# Patient Record
Sex: Male | Born: 1985 | Race: White | Hispanic: No | Marital: Single | Smoking: Current some day smoker
Health system: Southern US, Community
[De-identification: ages and names within clinical notes are randomized; demographics above are authoritative.]

## PROBLEM LIST (undated history)

## (undated) DIAGNOSIS — F419 Anxiety disorder, unspecified: Secondary | ICD-10-CM

## (undated) HISTORY — DX: Anxiety disorder, unspecified: F41.9

---

## 2013-10-14 LAB — COMPREHENSIVE METABOLIC PANEL
ALBUMIN: 4.6 g/dL (ref 3.4–5.0)
AST: 21 U/L (ref 15–37)
Alkaline Phosphatase: 62 U/L
Anion Gap: 3 — ABNORMAL LOW (ref 7–16)
BILIRUBIN TOTAL: 0.5 mg/dL (ref 0.2–1.0)
BUN: 11 mg/dL (ref 7–18)
Calcium, Total: 9.1 mg/dL (ref 8.5–10.1)
Chloride: 104 mmol/L (ref 98–107)
Co2: 29 mmol/L (ref 21–32)
Creatinine: 0.89 mg/dL (ref 0.60–1.30)
GLUCOSE: 88 mg/dL (ref 65–99)
OSMOLALITY: 271 (ref 275–301)
Potassium: 3.9 mmol/L (ref 3.5–5.1)
SGPT (ALT): 21 U/L (ref 12–78)
SODIUM: 136 mmol/L (ref 136–145)
Total Protein: 7.9 g/dL (ref 6.4–8.2)

## 2013-10-14 LAB — DRUG SCREEN, URINE
AMPHETAMINES, UR SCREEN: NEGATIVE (ref ?–1000)
BENZODIAZEPINE, UR SCRN: NEGATIVE (ref ?–200)
Barbiturates, Ur Screen: NEGATIVE (ref ?–200)
CANNABINOID 50 NG, UR ~~LOC~~: POSITIVE (ref ?–50)
COCAINE METABOLITE, UR ~~LOC~~: NEGATIVE (ref ?–300)
MDMA (Ecstasy)Ur Screen: NEGATIVE (ref ?–500)
Methadone, Ur Screen: NEGATIVE (ref ?–300)
OPIATE, UR SCREEN: NEGATIVE (ref ?–300)
Phencyclidine (PCP) Ur S: NEGATIVE (ref ?–25)
Tricyclic, Ur Screen: NEGATIVE (ref ?–1000)

## 2013-10-14 LAB — CBC
HCT: 51.9 % (ref 40.0–52.0)
HGB: 17.8 g/dL (ref 13.0–18.0)
MCH: 31.4 pg (ref 26.0–34.0)
MCHC: 34.2 g/dL (ref 32.0–36.0)
MCV: 92 fL (ref 80–100)
Platelet: 181 10*3/uL (ref 150–440)
RBC: 5.65 10*6/uL (ref 4.40–5.90)
RDW: 12.8 % (ref 11.5–14.5)
WBC: 8.7 10*3/uL (ref 3.8–10.6)

## 2013-10-14 LAB — ETHANOL
Ethanol %: <0.003 % (ref 0.000–0.080)
Ethanol: <3 mg/dL

## 2013-10-14 LAB — TSH: THYROID STIMULATING HORM: 1.02 u[IU]/mL

## 2013-10-14 LAB — SALICYLATE LEVEL

## 2013-10-14 LAB — ACETAMINOPHEN LEVEL

## 2013-10-15 ENCOUNTER — Inpatient Hospital Stay: Payer: Self-pay | Admitting: Psychiatry

## 2014-12-11 NOTE — H&P (Signed)
PATIENT NAME:  Andrew Stein, Andrew Stein MR#:  [id number] DATE OF BIRTH:  1986/08/03  DATE OF ADMISSION:  10/15/2013  REFERRING PHYSICIAN: Emergency room MD  ATTENDING PHYSICIAN: Kristine Linea, MD   IDENTIFYING DATA: Andrew Stein is a 29 year old male with no past psychiatric history.   CHIEF COMPLAINT: "I was really scared."   HISTORY OF PRESENT ILLNESS: Andrew Stein was brought to the hospital by his mother. She was fearful for his life. He has been recently depressed but also angry threatening to kill his wife and himself. 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 On Valentine's Day, his wife told him that she needs some time off and asked him to move out of the house they were sharing with her grandmother. He also lost his job in [employer] at that time. The patient's mother who lives in [place] drove to [place] to pick him up and all his belongings and brought him to [facility]. The patient already established contact with [facility] for treatment of depression and substance abuse. On the day of admission, however, he was quiet upset and after he made suicidal and homicidal threats his mother encouraged him to come to the hospital. There is a long history of substance abuse, but the patient reports that he has not been using any substances except for marijuana since July. In July he was arrested for possession of cocaine. He is court ordered to go to [facility] for substance abuse treatment. His legal history is rather convoluted, but apparently the patient was in court on Tuesday. He was hoping that his minor charge will be thrown out of court, but the judge decided to proceed. The patient is uncertain at this point what is going to happen. He has another court date on the 9th of March that he must attend. He denies symptoms of anxiety, denies psychotic symptoms, denies symptoms suggestive of bipolar mania. Recently he feels  depressed, on the edge, has poor sleep and decreased appetite. He can be tearful one minute and mad another. He feels that he has lost his temper many times since July. The legal charges are very stressful for him. The patient was in prison in 2003. Following release from prison, he finished 2 year associate degree and was hoping that is on a career path. His recent legal charges that he admits result from his stupidity will impede his progress.  PAST PSYCHIATRIC HISTORY: He has never been treated. No suicide attempts. He tried several benzodiazepines, Xanax and Klonopin. He does not like. His wife has been recently prescribed Valium and he thinks that it has been helpful. He has never been treated for substance use.   FAMILY PSYCHIATRIC HISTORY: Mother with bipolar.   PAST MEDICAL HISTORY: None.   ALLERGIES: No known drug allergies.   MEDICATIONS ON ADMISSION: None.   SOCIAL HISTORY: As above. He grew up in a complicated house. When his parents divorced he went to live with his grandparents. When grandparents divorced he went to live with his mother who was then remarried. He had behavioral problems at school, however, he graduated. He ended up in jail. He completed 2 year associate degree. He used to work as a Pensions consultant in [employer] but lost his job but believes that he could get it back. He had been married for 9 years and now his marriage is on the rocks. He still has legal charges pending. There is a family conflict. His  mother believes that he needs to stay in [place] away from the wife. The patient believes that he needs to go back to [place] where his home is, try to work things out with his wife, get his job and face his legal challenges.   REVIEW OF SYSTEMS: CONSTITUTIONAL: No fevers or chills. No weight changes.  EYES: No double or blurred vision.  ENT: No hearing loss.  RESPIRATORY: No shortness of breath or cough.  CARDIOVASCULAR: No chest pain or orthopnea.  GASTROINTESTINAL: No  abdominal pain, nausea, vomiting, or diarrhea.  GENITOURINARY: No incontinence or frequency.  ENDOCRINE: No heat or cold intolerance.  LYMPHATIC: No anemia or easy bruising.  INTEGUMENTARY: No acne or rash.  MUSCULOSKELETAL: No muscle or joint pain.  NEUROLOGIC: No tingling or weakness.  PSYCHIATRIC: See history of present illness for details.   PHYSICAL EXAMINATION: VITAL SIGNS: Blood pressure 112/76, pulse 80, respirations 20, temperature 98.1.  GENERAL: This is a well-developed male in no acute distress.  HEENT: The pupils are equal, round, and reactive to light. Sclerae anicteric.  NECK: Supple. No thyromegaly.  LUNGS: Clear to auscultation. No dullness to percussion.  HEART: Regular rhythm and rate. No murmurs, rubs, or gallops.  ABDOMEN: Soft, nontender, nondistended. Positive bowel sounds.  MUSCULOSKELETAL: Normal muscle strength in all extremities.  SKIN: No rashes or bruises.  LYMPHATIC: No cervical adenopathy.  NEUROLOGIC: Cranial nerves II through XII are intact.   LABORATORY DATA: Chemistries are within normal limits. Blood alcohol level is zero. LFTs within normal limits. TSH 1.02. Urine tox screen positive for cannabis. CBC within normal limits. Serum acetaminophen and salicylates are low.  MENTAL STATUS EXAMINATION ON ADMISSION: The patient is alert and oriented to person, place, time, and situation. He is pleasant, polite and cooperative, slightly tearful. He maintains some eye contact. His speech is of normal rhythm, rate, and volume. Mood is depressed with sad affect. Thought process is logical and goal oriented. Thought content: He denies suicidal or homicidal ideation at the moment, but prior to admission he was angry with his wife and threatened to kill her and himself. There are no delusions or paranoia. There are no auditory or visual hallucinations. His cognition is grossly intact. He registers three out of three and recalls three out of three objects after 5 minutes.  He can spell "world" forwards and backwards. He knows the current president. He is intelligent with good fund of knowledge. His insight and judgment are fair.   SUICIDE RISK ASSESSMENT ON ADMISSION: This is a patient with a history of substance abuse who became depressed, suicidal, and homicidal in the context of severe social stressors including marital conflict and separation. He is at increased risk of suicide.   DIAGNOSES: AXIS I:  1.  Major depressive episode. 2. Cannibis abuse.  AXIS II: Deferred.  AXIS III: Deferred.  AXIS IV: Mental illness, substance abuse, marital, family conflict, employment, legal  AXIS V: Global assessment of functioning 25.   PLAN: The patient was admitted to [facility] Medicine unit for safety, stabilization, and medication management. He was initially placed on suicide precautions and was closely monitored for any unsafe behaviors. He underwent full psychiatric and risk assessment. He received pharmacotherapy, individual and group psychotherapy, substance abuse counseling, and support from therapeutic milieu.  1.  Suicidal, homicidal ideation: The patient is able to contract for safety.  2.  Mood:  The patient has a history of mood swings, hyperactivity, impulsivity, and insomnia. His mother has bipolar. He would like  to try a medication to help him manage his symptoms better so that he can be a better husband and calmer on the job. He agreed to start Tegretol.  3.  Substance abuse:  The patient is already court ordered to go to [facility]. He also made connection with [facility]. He can go to [facility] in [place] or in [place] for both mental health and substance abuse problems.  4.  Disposition: He will be discharged to home. ____________________________ Ellin GoodieJolanta B. Jennet MaduroPucilowska, MD jbp:sb D: 10/16/2013 10:19:21 ET T: 10/16/2013 11:46:35 ET JOB#: [id number]  cc: Jolanta B. Jennet MaduroPucilowska, MD, <Dictator> Shari ProwsJOLANTA B PUCILOWSKA MD ELECTRONICALLY  SIGNED 10/31/2013 14:54

## 2020-04-25 ENCOUNTER — Other Ambulatory Visit: Payer: Self-pay

## 2020-04-25 ENCOUNTER — Emergency Department
Admission: EM | Admit: 2020-04-25 | Discharge: 2020-04-25 | Disposition: A | Discharge status: Home / Self Care | Payer: BC Managed Care – PPO | Attending: Emergency Medicine | Admitting: Emergency Medicine

## 2020-04-25 DIAGNOSIS — F17200 Nicotine dependence, unspecified, uncomplicated: Secondary | ICD-10-CM | POA: Insufficient documentation

## 2020-04-25 DIAGNOSIS — R0981 Nasal congestion: Secondary | ICD-10-CM | POA: Diagnosis present

## 2020-04-25 DIAGNOSIS — J069 Acute upper respiratory infection, unspecified: Secondary | ICD-10-CM | POA: Insufficient documentation

## 2020-04-25 MED ORDER — IBUPROFEN 400 MG PO TABS
400.0000 mg | ORAL_TABLET | Freq: Once | ORAL | Status: AC
  Administered 2020-04-25: 400 mg via ORAL
  Filled 2020-04-25: qty 1

## 2020-04-25 NOTE — ED Notes (Signed)
See triage note  Presents with fever and sinus pressure  States he sneezed and developed pain to frontal area   sxs' started yesterday

## 2020-04-25 NOTE — ED Notes (Signed)
Pt refused COVID swab. Provider aware.

## 2020-04-25 NOTE — Discharge Instructions (Addendum)
Follow-up with your primary care provider.  Tylenol ibuprofen as needed for fever, headache, body aches.  Begin using saline nasal spray and also you may take Mucinex to help with sinus congestion.  Sinus congestion and sinus infection are treated differently.  If you continue to have sinus congestion after 1 week your primary care provider can reevaluate if to see if antibiotics are needed at that time.

## 2020-04-25 NOTE — ED Provider Notes (Signed)
[facility] Emergency Department Provider Note   ____________________________________________   First MD Initiated Contact with Patient 04/25/20 1017     (approximate)  I have reviewed the triage vital signs and the nursing notes.   HISTORY  Chief Complaint URI   HPI Andrew Stein is a 34 y.o. male presents to the ED with complaint of sinus congestion, headache and fever that started suddenly yesterday.  He [place] that [place]  Is an smoker but infrequent.  He states that he he sneezed which increased his sinus congestion.  Patient has not had the Covid vaccine.  He rates pain as an 8 out of 10.      History reviewed. No pertinent past medical history.  There are no problems to display for this patient.   History reviewed. No pertinent surgical history.  Prior to Admission medications   Not on File    Allergies Patient has no known allergies.  No family history on file.  Social History Social History   Tobacco Use  . Smoking status: Current Some Day Smoker  . Smokeless tobacco: Never Used  Substance Use Topics  . Alcohol use: Yes  . Drug use: Not Currently    Review of Systems Constitutional: Subjective fever/chills Eyes: No visual changes. ENT: No sore throat.  Positive sinus congestion. Cardiovascular: Denies chest pain. Respiratory: Denies shortness of breath.  Negative for cough. Gastrointestinal: No abdominal pain.  No nausea, no vomiting.  No diarrhea.  No constipation. Musculoskeletal: Negative for joint aches. Skin: Negative for rash. Neurological: Positive for headaches, negative for focal weakness or numbness.  ____________________________________________   PHYSICAL EXAM:  VITAL SIGNS: ED Triage Vitals  Enc Vitals Group     BP 04/25/20 0858 126/85     Pulse Rate 04/25/20 0858 88     Resp 04/25/20 0858 16     Temp 04/25/20  0858 (!) 101.1 F (38.4 C)     Temp Source 04/25/20 0858 Oral     SpO2 04/25/20 0858 92 %     Weight 04/25/20 0855 170 lb (77.1 kg)     Height 04/25/20 0855 5\' 9"  (1.753 m)     Head Circumference --      Peak Flow --      Pain Score 04/25/20 0855 8     Pain Loc --      Pain Edu? --      Excl. in GC? --     Constitutional: Alert and oriented. Well appearing and in no acute distress. Eyes: Conjunctivae are normal. PERRL. EOMI. Head: Atraumatic. Nose: No congestion/rhinnorhea. Mouth/Throat: Mucous membranes are moist.  Oropharynx non-erythematous. Neck: No stridor.   Cardiovascular: Normal rate, regular rhythm. Grossly normal heart sounds.  Good peripheral circulation. Respiratory: Normal respiratory effort.  No retractions. Lungs CTAB. Gastrointestinal: Soft and nontender. No distention. No abdominal bruits. No CVA tenderness. Musculoskeletal: No lower extremity tenderness nor edema.  No joint effusions. Neurologic:  Normal speech and language. No gross focal neurologic deficits are appreciated. No gait instability. Skin:  Skin is warm, dry and intact. No rash noted. Psychiatric: Mood and affect are normal. Speech and behavior are normal.  ____________________________________________   LABS (all labs ordered are listed, but only abnormal results are displayed)  Labs Reviewed  SARS CORONAVIRUS 2 BY RT PCR (HOSPITAL ORDER, PERFORMED IN [facility])    PROCEDURES  Procedure(s) performed (including Critical Care):  Procedures  ____________________________________________   INITIAL IMPRESSION / ASSESSMENT AND PLAN / ED COURSE  As part of my medical decision making, I reviewed the following data within the electronic MEDICAL RECORD NUMBER Notes from prior ED visits and [place] Controlled Substance Database  Andrew Stein was evaluated in Emergency Department on 04/25/2020 for the symptoms described in the history of present illness. He was evaluated in the  context of the global COVID-19 pandemic, which necessitated consideration that the patient might be at risk for infection with the SARS-CoV-2 virus that causes COVID-19. Institutional protocols and algorithms that pertain to the evaluation of patients at risk for COVID-19 are in a state of rapid change based on information released by regulatory bodies including the CDC and federal and state organizations. These policies and algorithms were followed during the patient's care in the ED.  66-year-old male presents to the ED with complaint of sudden onset yesterday of sinus congestion, headache and fever.  Patient states that he was down [place] mowing and believes that he has a sinus infection instead.  A Covid test was ordered however patient refused to have a Covid test done.  Patient was told that patient's have similar symptoms to what he is describing at this time and are positive for Covid.  Patient still declined a Covid screening.  Patient was discharged with instructions to increase fluids.  Take Tylenol or ibuprofen as needed for fever and headache.  He is to follow-up with his PCP if any continued problems.  He was also told he could use saline nose spray to help with his nasal congestion.  URI versus Covid infection.  ____________________________________________   FINAL CLINICAL IMPRESSION(S) / ED DIAGNOSES  Final diagnoses:  Upper respiratory tract infection, unspecified type     ED Discharge Orders    None       Note:  This document was prepared using Dragon voice recognition software and may include unintentional dictation errors.    Tommi Rumps, PA-C 04/25/20 1149    Minna Antis, MD 04/25/20 1443

## 2020-04-25 NOTE — ED Triage Notes (Signed)
Pt c/o sinus congestion with HA and fever that started yesterday

## 2020-08-05 ENCOUNTER — Other Ambulatory Visit: Payer: Self-pay

## 2020-08-05 ENCOUNTER — Emergency Department
Admission: EM | Admit: 2020-08-05 | Discharge: 2020-08-05 | Disposition: A | Discharge status: Home / Self Care | Payer: BC Managed Care – PPO | Attending: Emergency Medicine | Admitting: Emergency Medicine

## 2020-08-05 DIAGNOSIS — Z566 Other physical and mental strain related to work: Secondary | ICD-10-CM | POA: Diagnosis not present

## 2020-08-05 DIAGNOSIS — F17200 Nicotine dependence, unspecified, uncomplicated: Secondary | ICD-10-CM | POA: Diagnosis not present

## 2020-08-05 DIAGNOSIS — R002 Palpitations: Secondary | ICD-10-CM | POA: Insufficient documentation

## 2020-08-05 DIAGNOSIS — F419 Anxiety disorder, unspecified: Secondary | ICD-10-CM | POA: Insufficient documentation

## 2020-08-05 LAB — CBC
HCT: 49.2 % (ref 39.0–52.0)
Hemoglobin: 17.4 g/dL — ABNORMAL HIGH (ref 13.0–17.0)
MCH: 32 pg (ref 26.0–34.0)
MCHC: 35.4 g/dL (ref 30.0–36.0)
MCV: 90.6 fL (ref 80.0–100.0)
Platelets: 219 10*3/uL (ref 150–400)
RBC: 5.43 MIL/uL (ref 4.22–5.81)
RDW: 12.3 % (ref 11.5–15.5)
WBC: 6.6 10*3/uL (ref 4.0–10.5)
nRBC: 0 % (ref 0.0–0.2)

## 2020-08-05 LAB — TROPONIN I (HIGH SENSITIVITY): Troponin I (High Sensitivity): 3 ng/L (ref <18)

## 2020-08-05 LAB — BASIC METABOLIC PANEL
Anion gap: 9 (ref 5–15)
BUN: 10 mg/dL (ref 6–20)
CO2: 26 mmol/L (ref 22–32)
Calcium: 8.9 mg/dL (ref 8.9–10.3)
Chloride: 103 mmol/L (ref 98–111)
Creatinine, Ser: 0.89 mg/dL (ref 0.61–1.24)
GFR, Estimated: >60 mL/min (ref >60)
Glucose, Bld: 173 mg/dL — ABNORMAL HIGH (ref 70–99)
Potassium: 3.8 mmol/L (ref 3.5–5.1)
Sodium: 138 mmol/L (ref 135–145)

## 2020-08-05 MED ORDER — LORAZEPAM 0.5 MG PO TABS: 0.5000 mg | ORAL_TABLET | Freq: Two times a day (BID) | ORAL | 0 refills | Status: AC | PRN

## 2020-08-05 NOTE — ED Provider Notes (Signed)
[facility] Emergency Department Provider Note   ____________________________________________    I have reviewed the triage vital signs and the nursing notes.   HISTORY  Chief Complaint Palpitations     HPI Andrew Stein is a 34 y.o. male who presents with complaints of anxiety leading to palpitations.  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  He reports "whenever I start thinking about things my heart starts racing ".  He is hoping to back down from this position to a calmer job in the future.  No chest pain currently although he has tightness when he has palpitations as well.  No fevers chills or cough.  Past Medical History:  Diagnosis Date  . Anxiety     There are no problems to display for this patient.   History reviewed. No pertinent surgical history.  Prior to Admission medications   Medication Sig Start Date End Date Taking? Authorizing Provider  LORazepam (ATIVAN) 0.5 MG tablet Take 1 tablet (0.5 mg total) by mouth 2 (two) times daily as needed for up to 7 days for anxiety. 08/05/20 08/12/20  Jene Every, MD     Allergies Patient has no known allergies.  History reviewed. No pertinent family history.  Social History Social History   Tobacco Use  . Smoking status: Current Some Day Smoker    Packs/day: 0.50  . Smokeless tobacco: Never Used  Substance Use Topics  . Alcohol use: Yes  . Drug use: Not Currently    Types: Marijuana    Review of Systems  Constitutional: No fever/chills Eyes: No visual changes.  ENT: No sore throat. Cardiovascular: As above Respiratory: As above Gastrointestinal: No abdominal pain.  No nausea, no vomiting.   Genitourinary: Negative for dysuria. Musculoskeletal: Negative for back pain. Skin: Negative for rash. Neurological: Negative for headaches or  weakness   ____________________________________________   PHYSICAL EXAM:  VITAL SIGNS: ED Triage Vitals  Enc Vitals Group     BP 08/05/20 1245 126/72     Pulse Rate 08/05/20 1245 84     Resp 08/05/20 1245 20     Temp 08/05/20 1245 99 F (37.2 C)     Temp Source 08/05/20 1245 Oral     SpO2 08/05/20 1245 97 %     Weight 08/05/20 1312 77.1 kg (170 lb)     Height 08/05/20 1312 1.753 m (5\' 9" )     Head Circumference --      Peak Flow --      Pain Score 08/05/20 1311 0     Pain Loc --      Pain Edu? --      Excl. in GC? --     Constitutional: Alert and oriented. No acute distress. Pleasant and interactive Eyes: Conjunctivae are normal.  H Nose: No congestion/rhinnorhea. Mouth/Throat: Mucous membranes are moist.   Neck:  Painless ROM Cardiovascular: Normal rate, regular rhythm. Grossly normal heart sounds.  Good peripheral circulation. Respiratory: Normal respiratory effort.  No retractions. Lungs CTAB. Gastrointestinal: Soft and nontender. No distention.  No CVA tenderness. Genitourinary: deferred Musculoskeletal: No lower extremity tenderness nor edema.  Warm and well perfused Neurologic:  Normal speech and language. No gross focal neurologic deficits are appreciated.  Skin:  Skin is warm, dry and intact. No rash noted. Psychiatric: Mood and affect are normal. Speech and behavior are normal.  ____________________________________________   LABS (all labs ordered are listed, but only  abnormal results are displayed)  Labs Reviewed  BASIC METABOLIC PANEL - Abnormal; Notable for the following components:      Result Value   Glucose, Bld 173 (*)    All other components within normal limits  CBC - Abnormal; Notable for the following components:   Hemoglobin 17.4 (*)    All other components within normal limits  TROPONIN I (HIGH SENSITIVITY)  TROPONIN I (HIGH SENSITIVITY)   ____________________________________________  EKG  ED ECG REPORT I, Jene Every, the  attending physician, personally viewed and interpreted this ECG.  Date: 08/05/2020  Rhythm: normal sinus rhythm QRS Axis: normal Intervals: Nonspecific changes ST/T Wave abnormalities: normal Narrative Interpretation: no evidence of acute ischemia  ____________________________________________  RADIOLOGY  None ____________________________________________   PROCEDURES  Procedure(s) performed: No  Procedures   Critical Care performed: No ____________________________________________   INITIAL IMPRESSION / ASSESSMENT AND PLAN / ED COURSE  Pertinent labs & imaging results that were available during my care of the patient were reviewed by me and considered in my medical decision making (see chart for details).  Patient presents with intermittent palpitations related to stress/anxiety.  Currently feeling well although he did have chest tightness this morning, now improved.  EKG with some nonspecific ST changes however high-sensitivity opponent is normal.  Lab work is otherwise unremarkable.  He reports the symptoms typically correspond with stressful events/anxiety.  We will give low-dose Ativan to be used as needed sparingly, recommend outpatient follow-up, exercise stress reduction techniques    ____________________________________________   FINAL CLINICAL IMPRESSION(S) / ED DIAGNOSES  Final diagnoses:  Palpitations  Stress at work        Note:  This document was prepared using Conservation officer, historic buildings and may include unintentional dictation errors.   Jene Every, MD 08/05/20 [phone]

## 2020-08-05 NOTE — ED Triage Notes (Signed)
Pt here with c/o palpitations and tachycardia, states some palpitations are worse than others. Pt reports high anxiety and stressers. Denies chest pain or shortness of breath.

## 2021-03-13 ENCOUNTER — Encounter: Payer: Self-pay | Admitting: Emergency Medicine

## 2021-03-13 ENCOUNTER — Emergency Department: Payer: BC Managed Care – PPO

## 2021-03-13 ENCOUNTER — Emergency Department
Admission: EM | Admit: 2021-03-13 | Discharge: 2021-03-13 | Disposition: A | Discharge status: Home / Self Care | Payer: BC Managed Care – PPO | Attending: Emergency Medicine | Admitting: Emergency Medicine

## 2021-03-13 ENCOUNTER — Other Ambulatory Visit: Payer: Self-pay

## 2021-03-13 DIAGNOSIS — Z8616 Personal history of COVID-19: Secondary | ICD-10-CM | POA: Diagnosis not present

## 2021-03-13 DIAGNOSIS — M546 Pain in thoracic spine: Secondary | ICD-10-CM | POA: Diagnosis not present

## 2021-03-13 DIAGNOSIS — M549 Dorsalgia, unspecified: Secondary | ICD-10-CM

## 2021-03-13 DIAGNOSIS — F17210 Nicotine dependence, cigarettes, uncomplicated: Secondary | ICD-10-CM | POA: Diagnosis not present

## 2021-03-13 MED ORDER — KETOROLAC TROMETHAMINE 30 MG/ML IJ SOLN
30.0000 mg | Freq: Once | INTRAMUSCULAR | Status: AC
  Administered 2021-03-13: 30 mg via INTRAMUSCULAR
  Filled 2021-03-13: qty 1

## 2021-03-13 MED ORDER — ETODOLAC 400 MG PO TABS: 400.0000 mg | ORAL_TABLET | Freq: Two times a day (BID) | ORAL | 0 refills | Status: AC

## 2021-03-13 NOTE — ED Provider Notes (Signed)
[facility] Emergency Department Provider Note  ____________________________________________   Event Date/Time   First MD Initiated Contact with Patient 03/13/21 [phone]     (approximate)  I have reviewed the triage vital signs and the nursing notes.   HISTORY  Chief Complaint Back Pain   HPI Andrew Stein is a 35 y.o. male presents to the ED with complaint of right upper back pain for several weeks.  Patient states "I think it is my lungs".  Patient denies any cough or difficulty breathing.  Patient denies any injury to his shoulder.  He reports that he had COVID 2 months ago but currently does not have any symptoms lingering.  Patient has not taken any over-the-counter medication for his back pain.  He rates his pain as 7 out of 10.       Past Medical History:  Diagnosis Date   Anxiety     There are no problems to display for this patient.   History reviewed. No pertinent surgical history.  Prior to Admission medications   Medication Sig Start Date End Date Taking? Authorizing Provider  etodolac (LODINE) 400 MG tablet Take 1 tablet (400 mg total) by mouth 2 (two) times daily. 03/13/21  Yes Tommi Rumps, PA-C    Allergies Patient has no known allergies.  History reviewed. No pertinent family history.  Social History Social History   Tobacco Use   Smoking status: Some Days    Packs/day: 0.50    Types: Cigarettes   Smokeless tobacco: Never  Substance Use Topics   Alcohol use: Yes   Drug use: Not Currently    Types: Marijuana    Review of Systems Constitutional: No fever/chills Eyes: No visual changes. ENT: No sore throat. Cardiovascular: Denies chest pain. Respiratory: Denies shortness of breath.  Negative for cough. Gastrointestinal: No abdominal pain.  No nausea, no vomiting.  No diarrhea.  No constipation. Genitourinary: Negative for dysuria. Musculoskeletal: Positive for right upper back pain. Skin: Negative for  rash. Neurological: Negative for headaches, focal weakness or numbness. ____________________________________________   PHYSICAL EXAM:  VITAL SIGNS: ED Triage Vitals  Enc Vitals Group     BP 03/13/21 0934 134/87     Pulse Rate 03/13/21 0934 (!) 59     Resp 03/13/21 0934 18     Temp 03/13/21 0934 98.1 F (36.7 C)     Temp Source 03/13/21 0934 Oral     SpO2 03/13/21 0934 97 %     Weight 03/13/21 0935 180 lb (81.6 kg)     Height 03/13/21 0935 5\' 9"  (1.753 m)     Head Circumference --      Peak Flow --      Pain Score 03/13/21 0934 7     Pain Loc --      Pain Edu? --      Excl. in GC? --     Constitutional: Alert and oriented. Well appearing and in no acute distress. Eyes: Conjunctivae are normal.  Head: Atraumatic. Nose: No congestion/rhinnorhea. Neck: No stridor.   Cardiovascular: Normal rate, regular rhythm. Grossly normal heart sounds.  Good peripheral circulation. Respiratory: Normal respiratory effort.  No retractions. Lungs CTAB. Gastrointestinal: Soft and nontender. No distention.  Musculoskeletal: Examination of the back there is no gross deformity.  There is no point tenderness on palpation of the thoracic spine.  No tenderness or deformity is noted of the right scapular area and patient is able to move right upper shoulder without any difficulty or restrictions.  Neurologic:  Normal speech and language. No gross focal neurologic deficits are appreciated. No gait instability. Skin:  Skin is warm, dry and intact. No rash noted. Psychiatric: Mood and affect are normal. Speech and behavior are normal.  ____________________________________________   LABS (all labs ordered are listed, but only abnormal results are displayed)  Labs Reviewed - No data to display ____________________________________________ ____________________________________________  RADIOLOGY Beaulah Corin, personally viewed and evaluated these images (plain radiographs) as part of my medical  decision making, as well as reviewing the written report by the radiologist.   Official radiology report(s): DG Chest 2 View  Result Date: 03/13/2021 CLINICAL DATA:  Right upper chest pain EXAM: CHEST - 2 VIEW COMPARISON:  None. FINDINGS: The heart size and mediastinal contours are within normal limits. Both lungs are clear. The visualized skeletal structures are unremarkable. IMPRESSION: No active cardiopulmonary disease. Electronically Signed   By: Judie Petit.  Shick M.D.   On: 03/13/2021 10:56    ____________________________________________   PROCEDURES  Procedure(s) performed (including Critical Care):  Procedures   ____________________________________________   INITIAL IMPRESSION / ASSESSMENT AND PLAN / ED COURSE  As part of my medical decision making, I reviewed the following data within the electronic MEDICAL RECORD NUMBER Notes from prior ED visits and [place] Controlled Substance Database  35 year old male presents to the ED with complaint of right upper back pain for several weeks.  Patient denies any injury to his shoulder.  He he is worried that there is something wrong with his lungs since he had COVID 2 months ago.  He has not taken any over-the-counter medication.  Chest x-ray was negative for any acute cardiopulmonary issues.  Patient was given Toradol 30 mg IM.  He was discharged with a prescription for etodolac 400 mg twice daily with food.  He is to follow-up with his PCP or urgent care if any continued problems or return to the emergency department for any severe worsening of his symptoms.   ____________________________________________   FINAL CLINICAL IMPRESSION(S) / ED DIAGNOSES  Final diagnoses:  Musculoskeletal back pain     ED Discharge Orders          Ordered    etodolac (LODINE) 400 MG tablet  2 times daily        03/13/21 1148             Note:  This document was prepared using Dragon voice recognition software and may include unintentional dictation  errors.    Tommi Rumps, PA-C 03/13/21 1216    Merwyn Katos, MD 03/14/21 1316

## 2021-03-13 NOTE — ED Notes (Signed)
See triage note  Presents with intermittent discomfort in upper back for the past 2 weeks   Denies any injury  Thinks it may be his lungs  Denies any cough and is afebrile

## 2021-03-13 NOTE — ED Triage Notes (Signed)
Pt comes into the ED via POV c/o upper back pain.  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  Pt state "I think its my lungs", but patient denies any cough, denies any worsening with inhalation, etc.  Pt ambulatory to triage.  Pt does admit he had COVID 2 months ago, but is currently symptoms free.

## 2021-03-13 NOTE — Discharge Instructions (Addendum)
Follow-up with your primary care provider or urgent care if any continued problems.  Begin taking etodolac 400 mg twice daily with food.  You may use ice or heat to your back as needed for discomfort.  If not improving you will need to be followed up.  Return to the emergency department if any severe worsening of your symptoms or urgent concerns.

## 2021-03-17 ENCOUNTER — Emergency Department
Admission: EM | Admit: 2021-03-17 | Discharge: 2021-03-17 | Disposition: A | Discharge status: Home / Self Care | Payer: BC Managed Care – PPO | Attending: Emergency Medicine | Admitting: Emergency Medicine

## 2021-03-17 ENCOUNTER — Other Ambulatory Visit: Payer: Self-pay

## 2021-03-17 ENCOUNTER — Encounter: Payer: Self-pay | Admitting: Emergency Medicine

## 2021-03-17 ENCOUNTER — Emergency Department: Payer: BC Managed Care – PPO

## 2021-03-17 DIAGNOSIS — R110 Nausea: Secondary | ICD-10-CM | POA: Insufficient documentation

## 2021-03-17 DIAGNOSIS — F17210 Nicotine dependence, cigarettes, uncomplicated: Secondary | ICD-10-CM | POA: Diagnosis not present

## 2021-03-17 DIAGNOSIS — R1031 Right lower quadrant pain: Secondary | ICD-10-CM | POA: Diagnosis not present

## 2021-03-17 LAB — URINALYSIS, COMPLETE (UACMP) WITH MICROSCOPIC
Bacteria, UA: NONE SEEN
Bilirubin Urine: NEGATIVE
Glucose, UA: NEGATIVE mg/dL
Hgb urine dipstick: NEGATIVE
Ketones, ur: NEGATIVE mg/dL
Leukocytes,Ua: NEGATIVE
Nitrite: NEGATIVE
Protein, ur: NEGATIVE mg/dL
Specific Gravity, Urine: 1.026 (ref 1.005–1.030)
Squamous Epithelial / HPF: NONE SEEN (ref 0–5)
pH: 5 (ref 5.0–8.0)

## 2021-03-17 LAB — CBC
HCT: 45.8 % (ref 39.0–52.0)
Hemoglobin: 15.8 g/dL (ref 13.0–17.0)
MCH: 31.8 pg (ref 26.0–34.0)
MCHC: 34.5 g/dL (ref 30.0–36.0)
MCV: 92.2 fL (ref 80.0–100.0)
Platelets: 192 10*3/uL (ref 150–400)
RBC: 4.97 MIL/uL (ref 4.22–5.81)
RDW: 12 % (ref 11.5–15.5)
WBC: 8.4 10*3/uL (ref 4.0–10.5)
nRBC: 0 % (ref 0.0–0.2)

## 2021-03-17 LAB — COMPREHENSIVE METABOLIC PANEL
ALT: 19 U/L (ref 0–44)
AST: 17 U/L (ref 15–41)
Albumin: 4.3 g/dL (ref 3.5–5.0)
Alkaline Phosphatase: 53 U/L (ref 38–126)
Anion gap: 6 (ref 5–15)
BUN: 13 mg/dL (ref 6–20)
CO2: 25 mmol/L (ref 22–32)
Calcium: 8.6 mg/dL — ABNORMAL LOW (ref 8.9–10.3)
Chloride: 106 mmol/L (ref 98–111)
Creatinine, Ser: 0.93 mg/dL (ref 0.61–1.24)
GFR, Estimated: >60 mL/min (ref >60)
Glucose, Bld: 83 mg/dL (ref 70–99)
Potassium: 3.9 mmol/L (ref 3.5–5.1)
Sodium: 137 mmol/L (ref 135–145)
Total Bilirubin: 0.7 mg/dL (ref 0.3–1.2)
Total Protein: 6.9 g/dL (ref 6.5–8.1)

## 2021-03-17 LAB — LIPASE, BLOOD: Lipase: 52 U/L — ABNORMAL HIGH (ref 11–51)

## 2021-03-17 MED ORDER — DICYCLOMINE HCL 10 MG PO CAPS: 10.0000 mg | ORAL_CAPSULE | Freq: Four times a day (QID) | ORAL | 0 refills | Status: AC

## 2021-03-17 MED ORDER — DICYCLOMINE HCL 10 MG PO CAPS
10.0000 mg | ORAL_CAPSULE | Freq: Once | ORAL | Status: AC
  Administered 2021-03-17: 10 mg via ORAL
  Filled 2021-03-17: qty 1

## 2021-03-17 MED ORDER — IOHEXOL 350 MG/ML SOLN
100.0000 mL | Freq: Once | INTRAVENOUS | Status: AC | PRN
  Administered 2021-03-17: 100 mL via INTRAVENOUS
  Filled 2021-03-17: qty 100

## 2021-03-17 NOTE — ED Triage Notes (Signed)
Pt to ER with c/o RLQ abdominal pain that started this AM.  States mild nausea this AM.  May have had urinary burning earlier, but not sure.  Pt reports bowel movements are softer than normal and more frequent.

## 2021-03-17 NOTE — ED Provider Notes (Signed)
[facility] Emergency Department Provider Note  ____________________________________________  Time seen: Approximately 8:52 PM  I have reviewed the triage vital signs and the nursing notes.   HISTORY  Chief Complaint Abdominal Pain    HPI Andrew Stein is a 35 y.o. male who presents the emergency department complaining of right lower quadrant abdominal pain since this morning.  He has had some nausea but no emesis.  He states that he felt like he had some dysuria earlier but none currently.  He has had multiple bowel movements but they are soft without being runny/diarrhea.  No constipation.  No fevers or chills.  Patient still has his appendix.  He still has his gallbladder.  All pain is in the right lower quadrant and is constant.  No alleviating or worsening factors at this time.       Past Medical History:  Diagnosis Date   Anxiety     There are no problems to display for this patient.   History reviewed. No pertinent surgical history.  Prior to Admission medications   Medication Sig Start Date End Date Taking? Authorizing Provider  dicyclomine (BENTYL) 10 MG capsule Take 1 capsule (10 mg total) by mouth 4 (four) times daily for 14 days. 03/17/21 03/31/21 Yes Geet Hosking, Delorise Royals, PA-C  etodolac (LODINE) 400 MG tablet Take 1 tablet (400 mg total) by mouth 2 (two) times daily. 03/13/21   Tommi Rumps, PA-C    Allergies Patient has no known allergies.  History reviewed. No pertinent family history.  Social History Social History   Tobacco Use   Smoking status: Some Days    Packs/day: 0.50    Types: Cigarettes   Smokeless tobacco: Never  Substance Use Topics   Alcohol use: Yes   Drug use: Not Currently    Types: Marijuana     Review of Systems  Constitutional: No fever/chills Eyes: No visual changes. No discharge ENT: No upper respiratory complaints. Cardiovascular: no chest pain. Respiratory: no cough. No  SOB. Gastrointestinal: Right lower abdominal pain.  Positive nausea, no vomiting.  No diarrhea.  No constipation. Genitourinary: 1 episode of dysuria.  None currently.  No hematuria Musculoskeletal: Negative for musculoskeletal pain. Skin: Negative for rash, abrasions, lacerations, ecchymosis. Neurological: Negative for headaches, focal weakness or numbness.  10 System ROS otherwise negative.  ____________________________________________   PHYSICAL EXAM:  VITAL SIGNS: ED Triage Vitals  Enc Vitals Group     BP 03/17/21 1544 136/84     Pulse Rate 03/17/21 1544 67     Resp 03/17/21 1544 18     Temp 03/17/21 1544 98.4 F (36.9 C)     Temp src --      SpO2 03/17/21 1544 98 %     Weight 03/17/21 1545 185 lb (83.9 kg)     Height 03/17/21 1545 5\' 9"  (1.753 m)     Head Circumference --      Peak Flow --      Pain Score 03/17/21 1545 7     Pain Loc --      Pain Edu? --      Excl. in GC? --      Constitutional: Alert and oriented. Well appearing and in no acute distress. Eyes: Conjunctivae are normal. PERRL. EOMI. Head: Atraumatic. ENT:      Ears:       Nose: No congestion/rhinnorhea.      Mouth/Throat: Mucous membranes are moist.  Neck: No stridor.   Cardiovascular: Normal rate, regular rhythm. Normal  S1 and S2.  Good peripheral circulation. Respiratory: Normal respiratory effort without tachypnea or retractions. Lungs CTAB. Good air entry to the bases with no decreased or absent breath sounds. Gastrointestinal: Bowel sounds 4 quadrants.  Patient is tender in the right lower quadrant with negative Rovsing's and obturator's.  No other significant tenderness on physical exam.  No palpable abnormalities.  No guarding or rigidity. No palpable masses. No distention. No CVA tenderness. Musculoskeletal: Full range of motion to all extremities. No gross deformities appreciated. Neurologic:  Normal speech and language. No gross focal neurologic deficits are appreciated.  Skin:  Skin is  warm, dry and intact. No rash noted. Psychiatric: Mood and affect are normal. Speech and behavior are normal. Patient exhibits appropriate insight and judgement.   ____________________________________________   LABS (all labs ordered are listed, but only abnormal results are displayed)  Labs Reviewed  LIPASE, BLOOD - Abnormal; Notable for the following components:      Result Value   Lipase 52 (*)    All other components within normal limits  COMPREHENSIVE METABOLIC PANEL - Abnormal; Notable for the following components:   Calcium 8.6 (*)    All other components within normal limits  URINALYSIS, COMPLETE (UACMP) WITH MICROSCOPIC - Abnormal; Notable for the following components:   Color, Urine YELLOW (*)    APPearance CLEAR (*)    All other components within normal limits  CBC   ____________________________________________  EKG   ____________________________________________  RADIOLOGY I personally viewed and evaluated these images as part of my medical decision making, as well as reviewing the written report by the radiologist.  ED Provider Interpretation: Findings consistent with right inguinal hernia and umbilical hernia without any other significant acute findings.  No evidence of incarcerated hernia  CT ABDOMEN PELVIS W CONTRAST  Result Date: 03/17/2021 CLINICAL DATA:  Right lower quadrant abdominal pain EXAM: CT ABDOMEN AND PELVIS WITH CONTRAST TECHNIQUE: Multidetector CT imaging of the abdomen and pelvis was performed using the standard protocol following bolus administration of intravenous contrast. CONTRAST:  OMNIPAQUE IOHEXOL 350 MG/ML SOLN COMPARISON:  None. FINDINGS: Lower chest: The visualized lung bases are clear bilaterally. The visualized heart and pericardium are unremarkable. Hepatobiliary: No focal liver abnormality is seen. No gallstones, gallbladder wall thickening, or biliary dilatation. Pancreas: Unremarkable Spleen: Unremarkable Adrenals/Urinary Tract:  Adrenal glands are unremarkable. Kidneys are normal, without renal calculi, focal lesion, or hydronephrosis. Bladder is unremarkable. Stomach/Bowel: Stomach is within normal limits. Appendix appears normal. No evidence of bowel wall thickening, distention, or inflammatory changes. No free intraperitoneal gas or fluid. Vascular/Lymphatic: No significant vascular findings are present. No enlarged abdominal or pelvic lymph nodes. Reproductive: Prostate is unremarkable. Other: Tiny fat containing umbilical hernia. Tiny broad-based fat containing right inguinal hernia. The rectum is unremarkable. Musculoskeletal: No acute or significant osseous findings. IMPRESSION: Tiny fat containing broad-based right inguinal and umbilical hernias. Otherwise unremarkable examination of the abdomen and pelvis. Electronically Signed   By: Helyn Numbers MD   On: 03/17/2021 21:57    ____________________________________________    PROCEDURES  Procedure(s) performed:    Procedures    Medications  dicyclomine (BENTYL) capsule 10 mg (has no administration in time range)  iohexol (OMNIPAQUE) 350 MG/ML injection 100 mL (100 mLs Intravenous Contrast Given 03/17/21 2147)     ____________________________________________   INITIAL IMPRESSION / ASSESSMENT AND PLAN / ED COURSE  Pertinent labs & imaging results that were available during my care of the patient were reviewed by me and considered in my medical decision making (  see chart for details).  Review of the [place] CSRS was performed in accordance of the [facility] prior to dispensing any controlled drugs.           Patient's diagnosis is consistent with right lower quadrant abdominal pain.  Patient presented to the emergency department complaining of right lower quadrant abdominal pain with some nausea.  No emesis.  No fevers or chills.  No diarrhea or constipation.  Patient was tender in the right lower quadrant and labs are reassuring.  Given the complaint imaging was  obtained which revealed no acute findings within the abdomen.  2 hernias which are not incarcerated or incidentally found on imaging.  Do not suspect that either these are the patient's source of pain.  Patient will be trialed on Bentyl for symptom relief.  Return precautions discussed with the patient at this time.  No further work-up at this time. Patient is given ED precautions to return to the ED for any worsening or new symptoms.     ____________________________________________  FINAL CLINICAL IMPRESSION(S) / ED DIAGNOSES  Final diagnoses:  RLQ abdominal pain      NEW MEDICATIONS STARTED DURING THIS VISIT:  ED Discharge Orders          Ordered    dicyclomine (BENTYL) 10 MG capsule  4 times daily        03/17/21 2251                This chart was dictated using voice recognition software/Dragon. Despite best efforts to proofread, errors can occur which can change the meaning. Any change was purely unintentional.    Racheal Patches, PA-C 03/17/21 2252    Merwyn Katos, MD 03/17/21 [phone]

## 2023-01-03 IMAGING — CT CT ABD-PELV W/ CM
2 of 4 series · 16 of 46 positions shown, 18 images · IV contrast (APPLIED)
Comparison: None.

CLINICAL DATA: Right lower quadrant abdominal pain

EXAM:
CT ABDOMEN AND PELVIS WITH CONTRAST
TECHNIQUE: Multidetector CT imaging of the abdomen and pelvis was performed
using the standard protocol following bolus administration of
intravenous contrast.
CONTRAST:  100mL OMNIPAQUE IOHEXOL 350 MG/ML SOLN

[Series 2: routine abd/pel with · axial · 0.86mm/px · z∈[-1058,-588]mm · 13 of 104 slices shown, 15 images]
[im 5/104  soft-tissue]
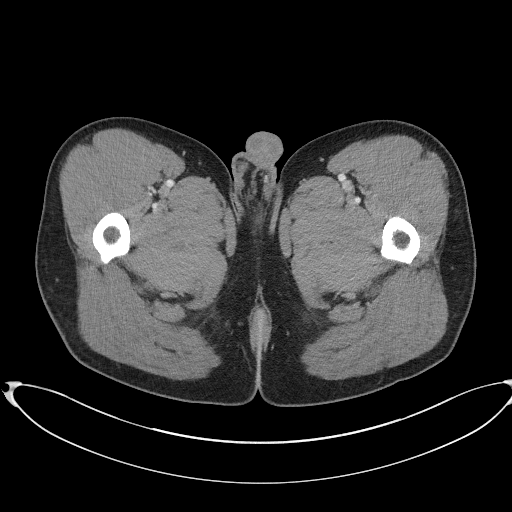
[im 5/104  bone]
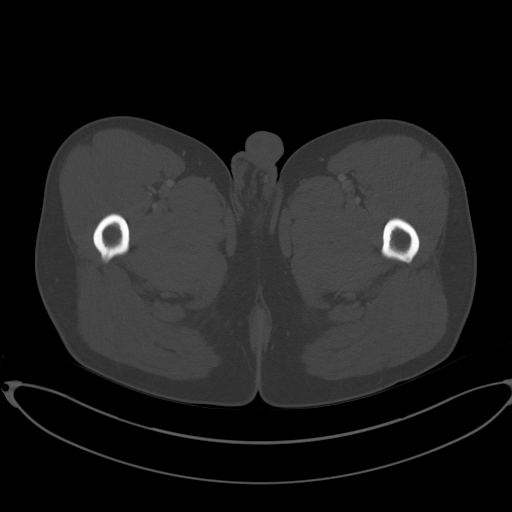
[im 15/104  soft-tissue]
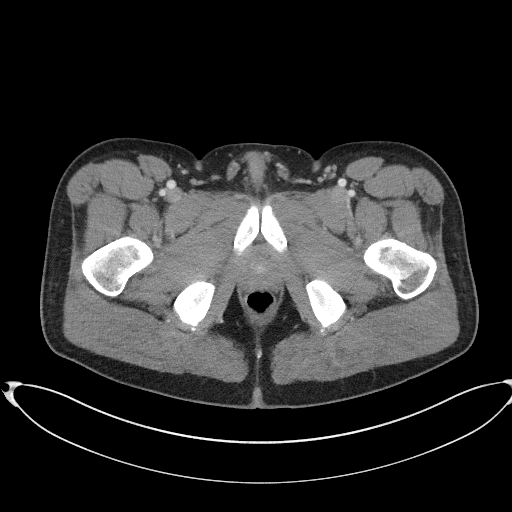
[im 24/104  soft-tissue]
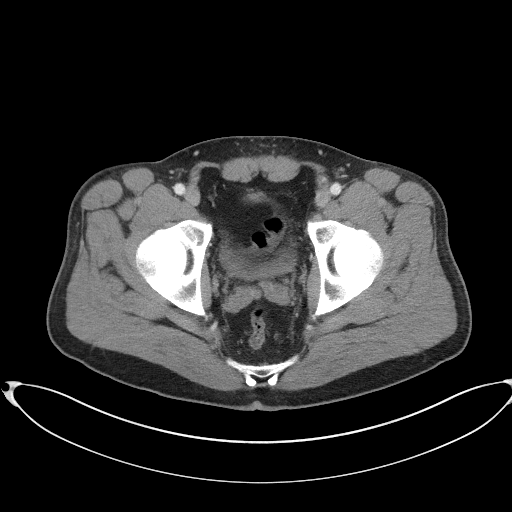
[im 29/104  soft-tissue]
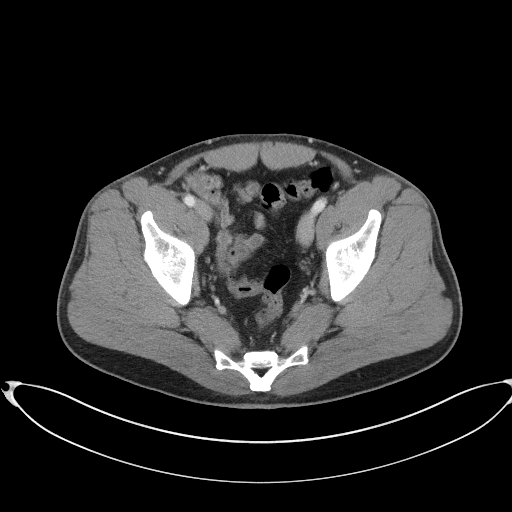
[im 38/104  soft-tissue]
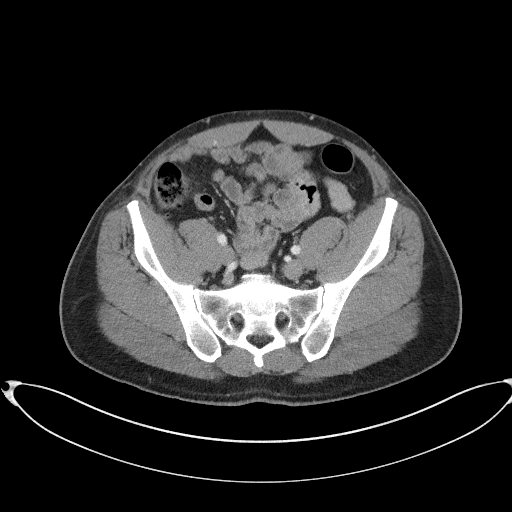
[im 43/104  soft-tissue]
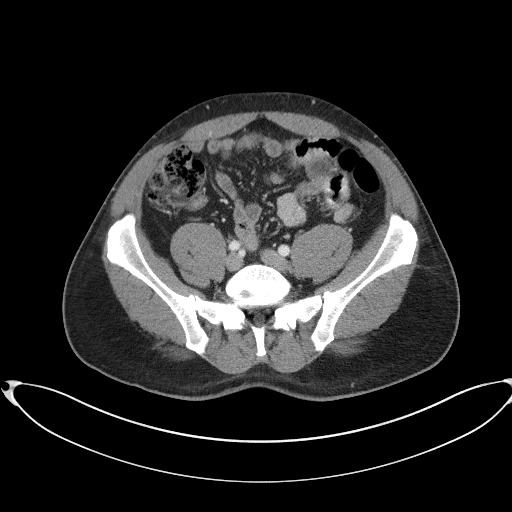
[im 52/104  soft-tissue]
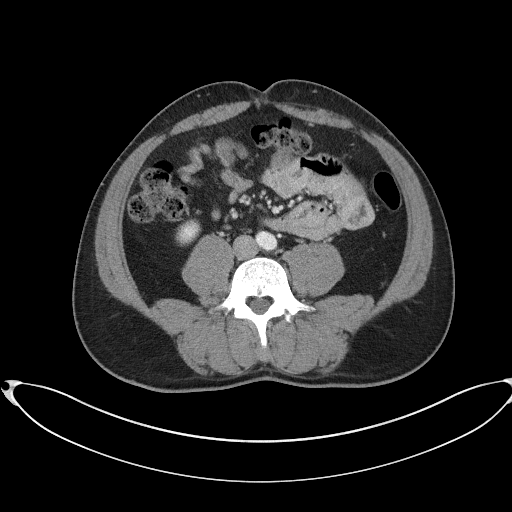
[im 61/104  soft-tissue]
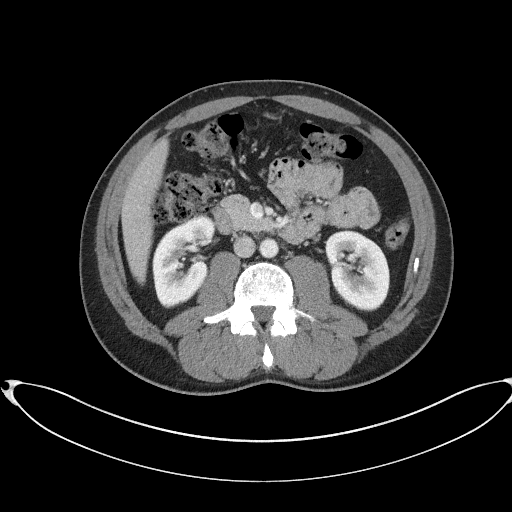
[im 66/104  soft-tissue]
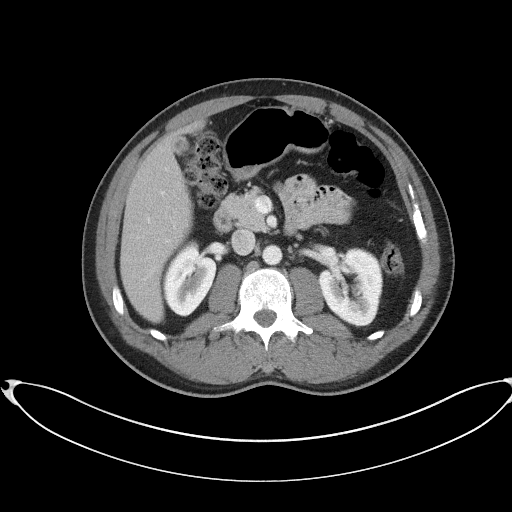
[im 66/104  bone]
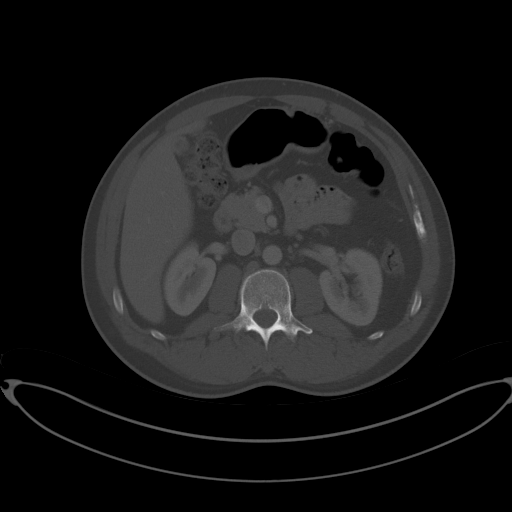
[im 75/104  soft-tissue]
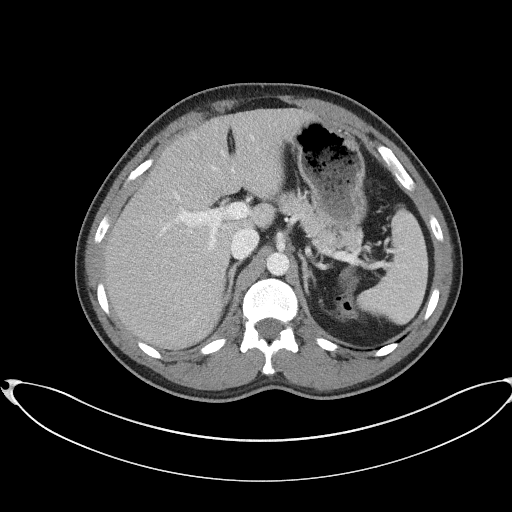
[im 80/104  soft-tissue]
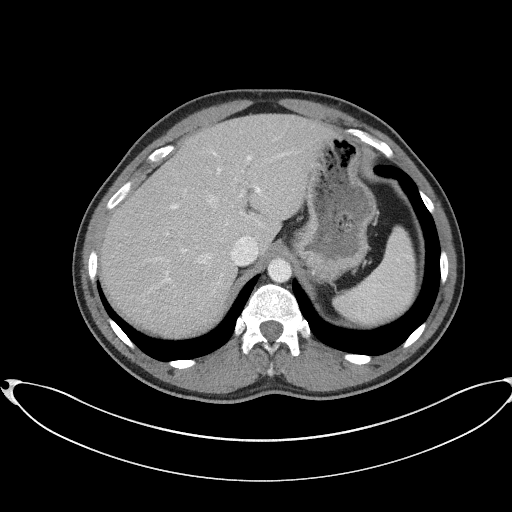
[im 89/104  soft-tissue]
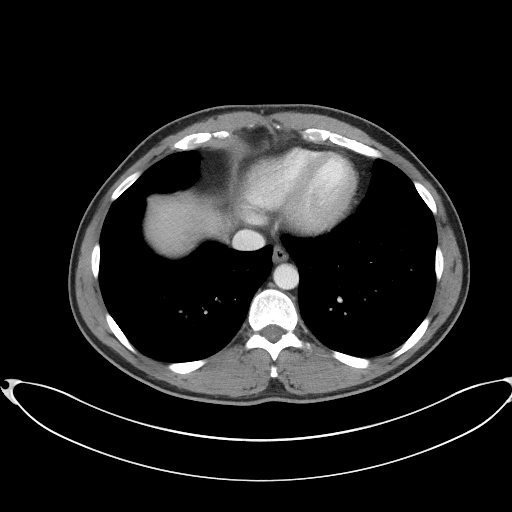
[im 99/104  soft-tissue]
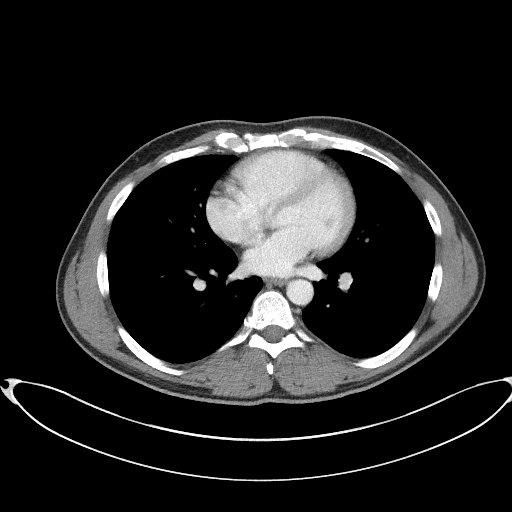

[Series 5: coronal st · coronal · 0.76mm/px · 3 of 103 slices shown]
[im 35/103  soft-tissue]
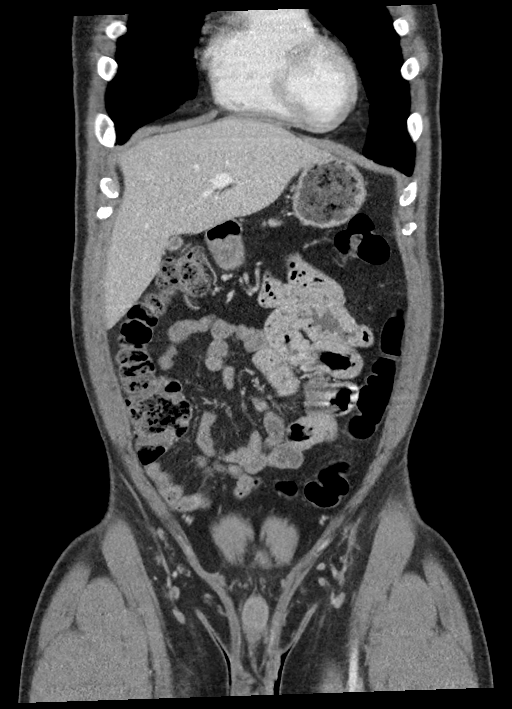
[im 46/103  soft-tissue]
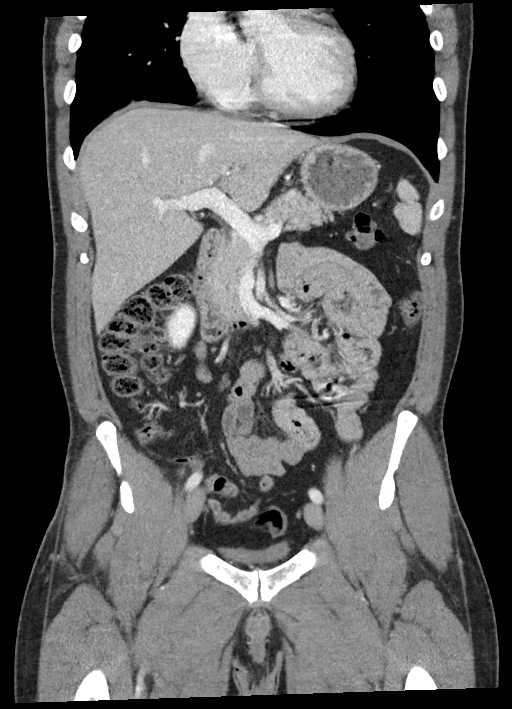
[im 57/103  soft-tissue]
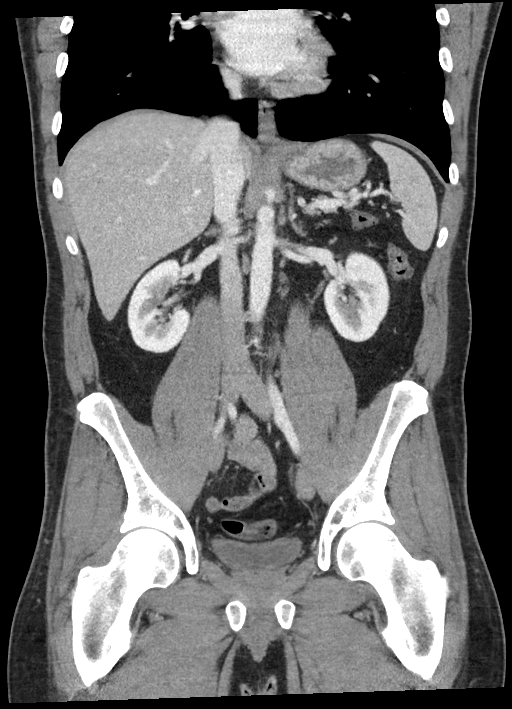

[16 of 46 positions shown; findings below may reference images not displayed]

FINDINGS: Lower chest: The visualized lung bases are clear bilaterally. The
visualized heart and pericardium are unremarkable.

Hepatobiliary: No focal liver abnormality is seen. No gallstones,
gallbladder wall thickening, or biliary dilatation.

Pancreas: Unremarkable

Spleen: Unremarkable

Adrenals/Urinary Tract: Adrenal glands are unremarkable. Kidneys are
normal, without renal calculi, focal lesion, or hydronephrosis.
Bladder is unremarkable.

Stomach/Bowel: Stomach is within normal limits. Appendix appears
normal. No evidence of bowel wall thickening, distention, or
inflammatory changes. No free intraperitoneal gas or fluid.

Vascular/Lymphatic: No significant vascular findings are present. No
enlarged abdominal or pelvic lymph nodes.

Reproductive: Prostate is unremarkable.

Other: Tiny fat containing umbilical hernia. Tiny broad-based fat
containing right inguinal hernia. The rectum is unremarkable.

Musculoskeletal: No acute or significant osseous findings.
IMPRESSION: Tiny fat containing broad-based right inguinal and umbilical
hernias. Otherwise unremarkable examination of the abdomen and
pelvis.
